# Patient Record
Sex: Male | Born: 2000 | Hispanic: No | Marital: Single | Smoking: Never smoker
Health system: Southern US, Community
[De-identification: ages and names within clinical notes are randomized; demographics above are authoritative.]

---
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
## 2020-05-08 ENCOUNTER — Emergency Department: Payer: BLUE CROSS/BLUE SHIELD

## 2020-05-08 ENCOUNTER — Emergency Department
Admission: EM | Admit: 2020-05-08 | Discharge: 2020-05-08 | Disposition: A | Discharge status: Home / Self Care | Payer: BLUE CROSS/BLUE SHIELD | Attending: Emergency Medicine | Admitting: Emergency Medicine

## 2020-05-08 ENCOUNTER — Encounter: Payer: Self-pay | Admitting: Radiology

## 2020-05-08 ENCOUNTER — Other Ambulatory Visit: Payer: Self-pay

## 2020-05-08 DIAGNOSIS — Y9389 Activity, other specified: Secondary | ICD-10-CM | POA: Insufficient documentation

## 2020-05-08 DIAGNOSIS — W228XXA Striking against or struck by other objects, initial encounter: Secondary | ICD-10-CM | POA: Insufficient documentation

## 2020-05-08 DIAGNOSIS — Y999 Unspecified external cause status: Secondary | ICD-10-CM | POA: Diagnosis not present

## 2020-05-08 DIAGNOSIS — S4991XA Unspecified injury of right shoulder and upper arm, initial encounter: Secondary | ICD-10-CM | POA: Diagnosis present

## 2020-05-08 DIAGNOSIS — S4291XA Fracture of right shoulder girdle, part unspecified, initial encounter for closed fracture: Secondary | ICD-10-CM

## 2020-05-08 DIAGNOSIS — M25511 Pain in right shoulder: Secondary | ICD-10-CM

## 2020-05-08 DIAGNOSIS — Y929 Unspecified place or not applicable: Secondary | ICD-10-CM | POA: Diagnosis not present

## 2020-05-08 DIAGNOSIS — S43014A Anterior dislocation of right humerus, initial encounter: Secondary | ICD-10-CM | POA: Diagnosis not present

## 2020-05-08 DIAGNOSIS — Z79899 Other long term (current) drug therapy: Secondary | ICD-10-CM | POA: Insufficient documentation

## 2020-05-08 MED ORDER — PROPOFOL 10 MG/ML IV BOLUS
INTRAVENOUS | Status: AC
  Filled 2020-05-08: qty 20

## 2020-05-08 MED ORDER — OXYCODONE HCL 5 MG PO TABS
5.0000 mg | ORAL_TABLET | Freq: Once | ORAL | Status: AC
  Administered 2020-05-08: 5 mg via ORAL
  Filled 2020-05-08: qty 1

## 2020-05-08 MED ORDER — PROPOFOL 10 MG/ML IV BOLUS
INTRAVENOUS | Status: AC | PRN
  Administered 2020-05-08: 50 mg via INTRAVENOUS
  Administered 2020-05-08: 10 mg via INTRAVENOUS
  Administered 2020-05-08: 20 mg via INTRAVENOUS
  Administered 2020-05-08: 30 mg via INTRAVENOUS
  Administered 2020-05-08 (×2): 20 mg via INTRAVENOUS
  Administered 2020-05-08: 30 mg via INTRAVENOUS
  Administered 2020-05-08: 20 mg via INTRAVENOUS
  Administered 2020-05-08: 10 mg via INTRAVENOUS

## 2020-05-08 MED ORDER — PROPOFOL 10 MG/ML IV BOLUS
1.0000 mg/kg | Freq: Once | INTRAVENOUS | Status: DC
  Filled 2020-05-08: qty 20

## 2020-05-08 MED ORDER — ACETAMINOPHEN 500 MG PO TABS
1000.0000 mg | ORAL_TABLET | Freq: Once | ORAL | Status: AC
  Administered 2020-05-08: 1000 mg via ORAL
  Filled 2020-05-08: qty 2

## 2020-05-08 MED ORDER — SODIUM CHLORIDE 0.9 % IV SOLN
INTRAVENOUS | Status: AC | PRN
  Administered 2020-05-08: 1000 mL via INTRAVENOUS

## 2020-05-08 MED ORDER — FENTANYL CITRATE (PF) 100 MCG/2ML IJ SOLN
50.0000 ug | Freq: Once | INTRAMUSCULAR | Status: AC
  Administered 2020-05-08: 50 ug via NASAL
  Filled 2020-05-08: qty 2

## 2020-05-08 MED ORDER — LIDOCAINE-EPINEPHRINE 2 %-1:100000 IJ SOLN
20.0000 mL | Freq: Once | INTRAMUSCULAR | Status: AC
  Administered 2020-05-08: 20 mL

## 2020-05-08 MED ORDER — LIDOCAINE-EPINEPHRINE 1 %-1:100000 IJ SOLN
20.0000 mL | Freq: Once | INTRAMUSCULAR | Status: DC
  Filled 2020-05-08: qty 20

## 2020-05-08 NOTE — ED Triage Notes (Signed)
Pt reports playing basketball and was hit while jumping in the air, pt reports that his shoulder has been out of place 2 times prior, reports that he is having pain and it reminds him of the last time it was dislocated

## 2020-05-08 NOTE — ED Notes (Signed)
Pt able to eat and drink, ambulatory to toilet

## 2020-05-08 NOTE — ED Notes (Signed)
Pt states no change in pain at this time, 2nd injection of lidocaine with epi given by EDP Smith at this time.

## 2020-05-08 NOTE — Sedation Documentation (Signed)
Physician able to reduce R shoulder at this time.

## 2020-05-08 NOTE — ED Notes (Signed)
Pt to room from X-ray.

## 2020-05-08 NOTE — Sedation Documentation (Signed)
Pt given popsicle for PO challenge.

## 2020-05-08 NOTE — Sedation Documentation (Signed)
Pt alert, eyes open and speaking with this RN, Hospital doctor, RN and his mother. Pt's R arm currently in sling. Awaiting X-ray to come to bedside to repeat shoulder X-ray.

## 2020-05-08 NOTE — ED Provider Notes (Signed)
[facility] Emergency Department Provider Note ____________________________________________   First MD Initiated Contact with Patient 05/08/20 1624     (approximate)  I have reviewed the triage vital signs and the nursing notes.  HISTORY  Chief Complaint Shoulder Pain   HPI Colton Whitaker is a 19 y.o. malewho presents to the ED for evaluation of right shoulder pain and possible dislocation.   Chart review indicates no relevant history.  Patient self-reports a history of 2 previous right shoulder dislocations, most recently 2 years ago.  No surgical history to the shoulder.  Patient reports being in his typical state of health until about 3 hours ago when he was playing basketball with friends when he went up for a lay up, 2 people hit him on the air and reports dislocation on his way down.  Reports immediate right shoulder pain that has been constant and present for the past 3 hours.  8/10 intensity, aching in nature and patient has not taken medications prior to arrival.  History reviewed. No pertinent past medical history.  There are no problems to display for this patient.   No past surgical history on file.  Prior to Admission medications   Medication Sig Start Date End Date Taking? Authorizing Provider  ibuprofen (ADVIL,MOTRIN) 400 MG tablet Take 1 tablet (400 mg total) by mouth every 6 (six) hours as needed. 06/17/18   Enid Derry, PA-C    Allergies Patient has no known allergies.  No family history on file.  Social History Social History   Tobacco Use  . Smoking status: Not on file  Substance Use Topics  . Alcohol use: Not on file  . Drug use: Not on file    Review of Systems  Constitutional: No fever/chills Eyes: No visual changes. ENT: No sore throat. Cardiovascular: Denies chest pain. Respiratory: Denies shortness of breath. Gastrointestinal: No abdominal pain.  No nausea, no vomiting.  No diarrhea.  No  constipation. Genitourinary: Negative for dysuria. Musculoskeletal: Negative for back pain.  Positive for right shoulder pain Skin: Negative for rash. Neurological: Negative for headaches, focal weakness or numbness.   ____________________________________________   PHYSICAL EXAM:  VITAL SIGNS: Vitals:   05/08/20 1945 05/08/20 2000  BP: 139/80 137/73  Pulse: 81 81  Resp: 18 13  Temp:    SpO2: 100% 100%      Constitutional: Alert and oriented. Well appearing and in no acute distress.  Sling to his right shoulder.  Pleasant and conversational full sentences without distress. Eyes: Conjunctivae are normal. PERRL. EOMI. Head: Atraumatic. Nose: No congestion/rhinnorhea. Mouth/Throat: Mucous membranes are moist.  Oropharynx non-erythematous. Neck: No stridor. No cervical spine tenderness to palpation. Cardiovascular: Normal rate, regular rhythm. Grossly normal heart sounds.  Good peripheral circulation. Respiratory: Normal respiratory effort.  No retractions. Lungs CTAB. Gastrointestinal: Soft , nondistended, nontender to palpation. No abdominal bruits. No CVA tenderness. Musculoskeletal: No lower extremity tenderness nor edema.  No joint effusions. No signs of acute trauma. Right upper extremity is distally neurovascularly intact. Soft tissue inferior to the glenoid that is asymmetric right to the left most consistent with a dislocation. Neurologic:  Normal speech and language. No gross focal neurologic deficits are appreciated. No gait instability noted. Skin:  Skin is warm, dry and intact. No rash noted. Psychiatric: Mood and affect are normal. Speech and behavior are normal.  ____________________________________________   LABS (all labs ordered are listed, but only abnormal results are displayed)  Labs Reviewed - No data to display  ____________________________________________  RADIOLOGY  ED MD interpretation: Initial x-ray right shoulder demonstrates anterior  dislocation.  Follow-up x-ray indicates successful reduction of his right shoulder.  Official radiology report(s): DG Shoulder Right  Result Date: 05/08/2020 CLINICAL DATA:  Right shoulder dislocation status post reduction. EXAM: RIGHT SHOULDER - 2+ VIEW COMPARISON:  None. FINDINGS: Internally rotated and trans-scapular views of the right shoulder demonstrate satisfactory reduction. There is likely a small Hill-Sachs impaction. Subtle inferior glenoid irregularity on 1 of the internally rotated projections, bony Bankart injury not excluded. IMPRESSION: 1. Successful reduction of the right shoulder. 2. Suspected small Hill-Sachs impaction. 3. Equivocal appearance for bony Bankart injury. Electronically Signed   By: Gaylyn RongWalter  Liebkemann M.D.   On: 05/08/2020 19:24   DG Shoulder Right  Result Date: 05/08/2020 CLINICAL DATA:  Trauma with history of shoulder dislocation EXAM: RIGHT SHOULDER - 2+ VIEW COMPARISON:  06/17/2018 FINDINGS: Anterior inferior dislocation of the humeral head. No Hill-Sachs deformity identified. Visualized portion of the right hemithorax is normal. IMPRESSION: Anterior inferior dislocation of the humeral head. Electronically Signed   By: Jeronimo GreavesKyle  Talbot M.D.   On: 05/08/2020 16:28   ____________________________________________   PROCEDURES and INTERVENTIONS  Procedure(s) performed (including Critical Care):  .Sedation  Date/Time: 05/08/2020 6:36 PM Performed by: Delton PrairieSmith, Odin Mariani, MD Authorized by: Delton PrairieSmith, Drayson Dorko, MD   Consent:    Consent obtained:  Verbal and written   Consent given by:  Patient and parent   Risks discussed:  Allergic reaction, dysrhythmia, inadequate sedation, nausea, prolonged hypoxia resulting in organ damage, prolonged sedation necessitating reversal, respiratory compromise necessitating ventilatory assistance and intubation and vomiting   Alternatives discussed:  Analgesia without sedation, anxiolysis and regional anesthesia Universal protocol:    Procedure  explained and questions answered to patient or proxy's satisfaction: yes     Relevant documents present and verified: yes     Test results available and properly labeled: yes     Imaging studies available: yes     Required blood products, implants, devices, and special equipment available: yes     Site/side marked: yes     Immediately prior to procedure a time out was called: yes     Patient identity confirmation method:  Verbally with patient and provided demographic data Indications:    Procedure performed:  Dislocation reduction   Procedure necessitating sedation performed by:  Physician performing sedation Pre-sedation assessment:    Time since last food or drink:  4 hrs   ASA classification: class 1 - normal, healthy patient     Neck mobility: normal     Mouth opening:  3 or more finger widths   Thyromental distance:  4 finger widths   Mallampati score:  I - soft palate, uvula, fauces, pillars visible   Pre-sedation assessments completed and reviewed: airway patency, cardiovascular function, hydration status, mental status, nausea/vomiting, pain level, respiratory function and temperature   Immediate pre-procedure details:    Reassessment: Patient reassessed immediately prior to procedure     Reviewed: vital signs, relevant labs/tests and NPO status     Verified: bag valve mask available, emergency equipment available, intubation equipment available, IV patency confirmed, oxygen available and suction available   Procedure details (see MAR for exact dosages):    Preoxygenation:  Nasal cannula   Sedation:  Propofol   Intended level of sedation: deep   Intra-procedure monitoring:  Blood pressure monitoring, cardiac monitor, continuous pulse oximetry, frequent LOC assessments, frequent vital sign checks and continuous capnometry   Intra-procedure events: none     Total Provider sedation time (  minutes):  10 Post-procedure details:    Attendance: Constant attendance by certified staff  until patient recovered     Recovery: Patient returned to pre-procedure baseline     Post-sedation assessments completed and reviewed: airway patency, cardiovascular function, hydration status, mental status, nausea/vomiting, pain level, respiratory function and temperature     Patient is stable for discharge or admission: yes     Patient tolerance:  Tolerated well, no immediate complications  Reduction of dislocation  Date/Time: 05/08/2020 9:46 PM Performed by: Delton Prairie, MD Authorized by: Delton Prairie, MD  Local anesthesia used: yes Anesthesia method: Intra-articular lidocaine to the right shoulder.  Anesthesia: Local anesthesia used: yes Local Anesthetic: lidocaine 2% with epinephrine Anesthetic total: 20 mL  Sedation: Patient sedated: yes Sedatives: propofol  Patient tolerance: patient tolerated the procedure well with no immediate complications Comments: Traction and countertraction initially used with palpable bony movement, but no distinct clunk to indicate reduction. Then externally rotated the right upper extremity and provided abduction traction with good reduction.  Marland Kitchen1-3 Lead EKG Interpretation Performed by: Delton Prairie, MD Authorized by: Delton Prairie, MD     Interpretation: normal     ECG rate:  80   ECG rate assessment: normal     Rhythm: sinus rhythm     Ectopy: none     Conduction: normal      Medications  propofol (DIPRIVAN) 10 mg/mL bolus/IV push 79.8 mg (79.8 mg Intravenous See Procedure Record 05/08/20 1819)  acetaminophen (TYLENOL) tablet 1,000 mg (1,000 mg Oral Given 05/08/20 1701)  oxyCODONE (Oxy IR/ROXICODONE) immediate release tablet 5 mg (5 mg Oral Given 05/08/20 1701)  fentaNYL (SUBLIMAZE) injection 50 mcg (50 mcg Nasal Given 05/08/20 1659)  lidocaine-EPINEPHrine (XYLOCAINE W/EPI) 2 %-1:100000 (with pres) injection 20 mL (20 mLs Infiltration Given 05/08/20 1702)  0.9 %  sodium chloride infusion ( Intravenous Stopped 05/08/20 2006)  propofol (DIPRIVAN) 10  mg/mL bolus/IV push (20 mg Intravenous Given 05/08/20 1827)  propofol (DIPRIVAN) 10 mg/mL bolus/IV push (  Return to [facility] 05/08/20 1855)    ____________________________________________   MDM / ED COURSE  19 year old male with history of right shoulder dislocation presents with recurrent anterior dislocation amenable to bedside reduction and outpatient management.  Normal vital signs on room air.  Exam demonstrates dislocated right shoulder that is neurovascularly intact.  No laceration or evidence of open injury.  No further signs of trauma beyond his dislocated right shoulder.  X-ray confirms dislocation without fracture.  Initially attempted intra-articular lidocaine for reduction without sedation, but this was poorly tolerated.  Then transitioned to moderate sedation with propofol, as documented above, with good subsequent reduction of his right shoulder.  Repeat x-ray confirms reduction, possible Hill-Sachs deformity.  Patient placed in sling after reduction.  He wakes from sedation without complication.  Subsequently tolerating p.o. intake and ambulating without difficulty.  Mother reports that he has sports medicine physician already, and I provided them with additional orthopedic surgery information for follow-up.  We discussed outpatient management and we discussed return precautions for the ED.  Patient medically stable for discharge home.  Clinical Course as of May 09 2147  Wynelle Link May 08, 2020  1704 Intra-articular lidocaine applied   [DS]  1716 Additional 10 cc of lidocaine applied   [DS]  1742 Continued pain despite intra-articular injection.  Patient will not tolerate reduction like this.  We will place IV and sedate the patient .patient and mom are agreeable to this.   [DS]  1830 Reduction complete associated by propofol sedation.  Well-tolerated.  Required all 200 mg of propofol   [DS]  1858 Reassessed.  Repeat x-rays look good.  Patient awake and at his baseline mental status.  We  discussed outpatient management with orthopedic follow-up.  Mother reports that patient has a sports medicine physician that is available to him   [DS]    Clinical Course User Index [DS] Delton Prairie, MD     ____________________________________________   FINAL CLINICAL IMPRESSION(S) / ED DIAGNOSES  Final diagnoses:  Acute pain of right shoulder  Traumatic closed displaced fracture of right shoulder with anterior dislocation, initial encounter     ED Discharge Orders    None       Sael Furches   Note:  This document was prepared using Dragon voice recognition software and may include unintentional dictation errors.   Delton Prairie, MD 05/08/20 2151

## 2020-05-08 NOTE — ED Notes (Addendum)
Peripheral IV discontinued. Catheter intact. No signs of infiltration or redness. Gauze applied to IV site.   Discharge instructions reviewed with patient. Questions fielded by this RN. Patient verbalizes understanding of instructions. Patient discharged home in stable condition per smith . No acute distress noted at time of discharge.   Pt ambu to family vehicle  Pt and mother have 2 originals of moderate sedation paperwork, copy filed

## 2020-05-08 NOTE — Discharge Instructions (Addendum)
Please take Tylenol and ibuprofen/Advil for your pain.  It is safe to take them together, or to alternate them every few hours.  Take up to 1000mg  of Tylenol at a time, up to 4 times per day.  Do not take more than 4000 mg of Tylenol in 24 hours.  For ibuprofen, take 400-600 mg, 4-5 times per day.  Keep your right arm in a sling and follow up with the orthopedic doctors in the next 1-2 weeks. Their information is attached.  You may also follow-up with your sports medicine doctor.  Return to the ED with any recurrent dislocations, inability to feel/use your right arm/hand.

## 2020-05-08 NOTE — Sedation Documentation (Signed)
Family updated as to patient's status. Pt's mother remains at bedside at this time.

## 2021-07-10 IMAGING — CR DG SHOULDER 2+V*R*
1 series · 3 of 3 positions shown · non-contrast
Comparison: 06/17/2018

CLINICAL DATA: Trauma with history of shoulder dislocation

EXAM:
RIGHT SHOULDER - 2+ VIEW

[Series 1: dg shoulder right · 0.14mm/px · 3 of 3 slices shown]
[im 1/3]
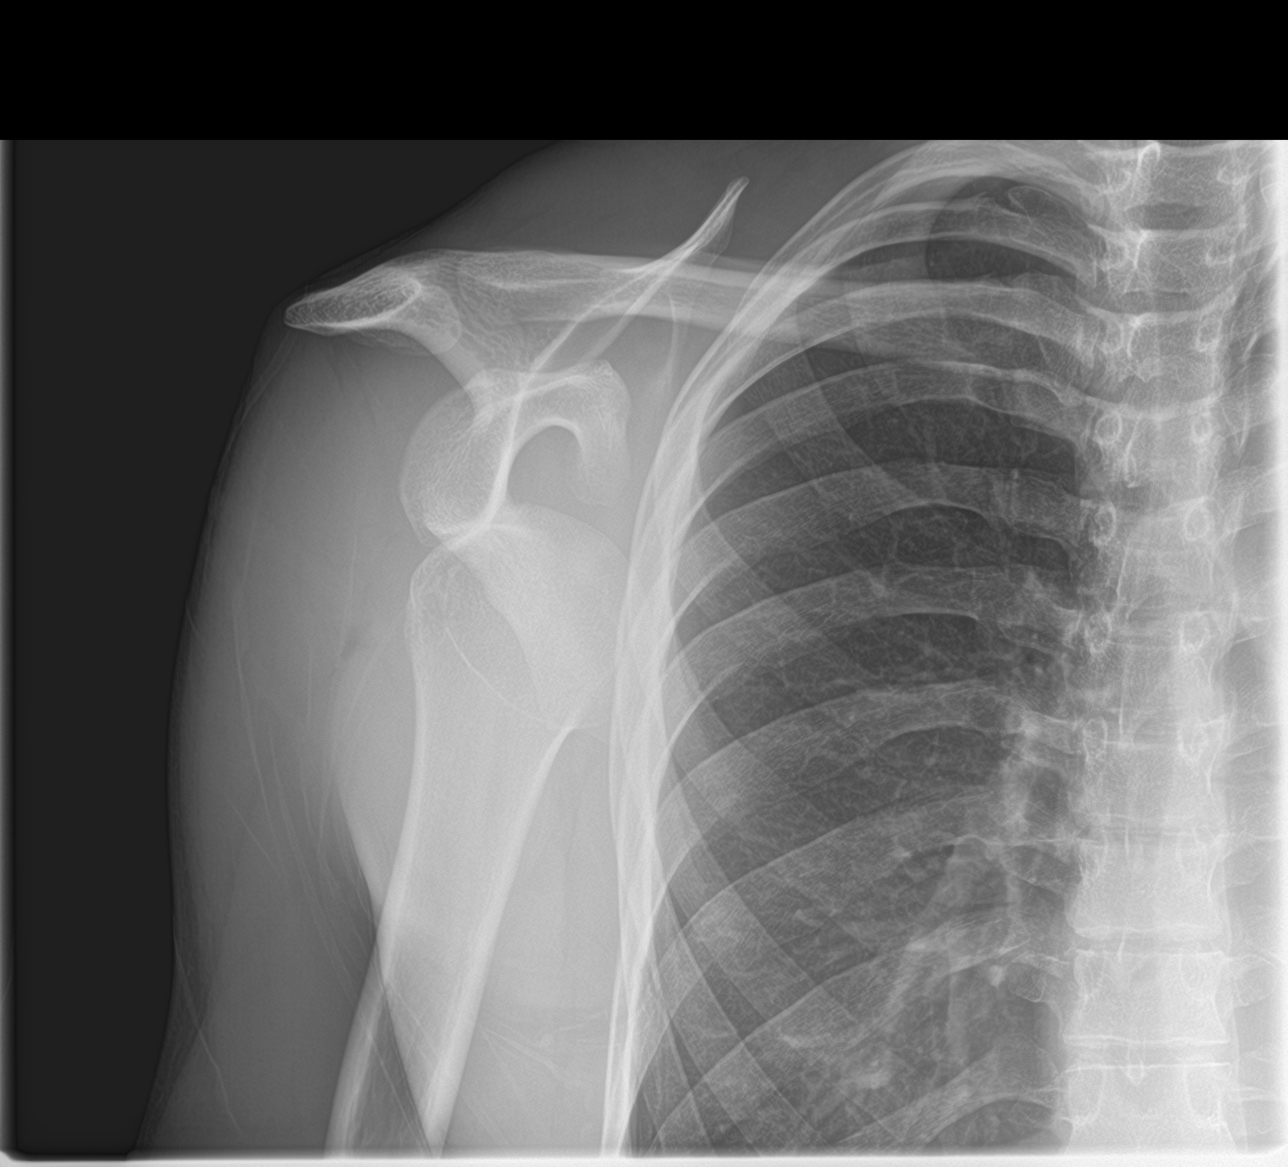
[im 2/3]
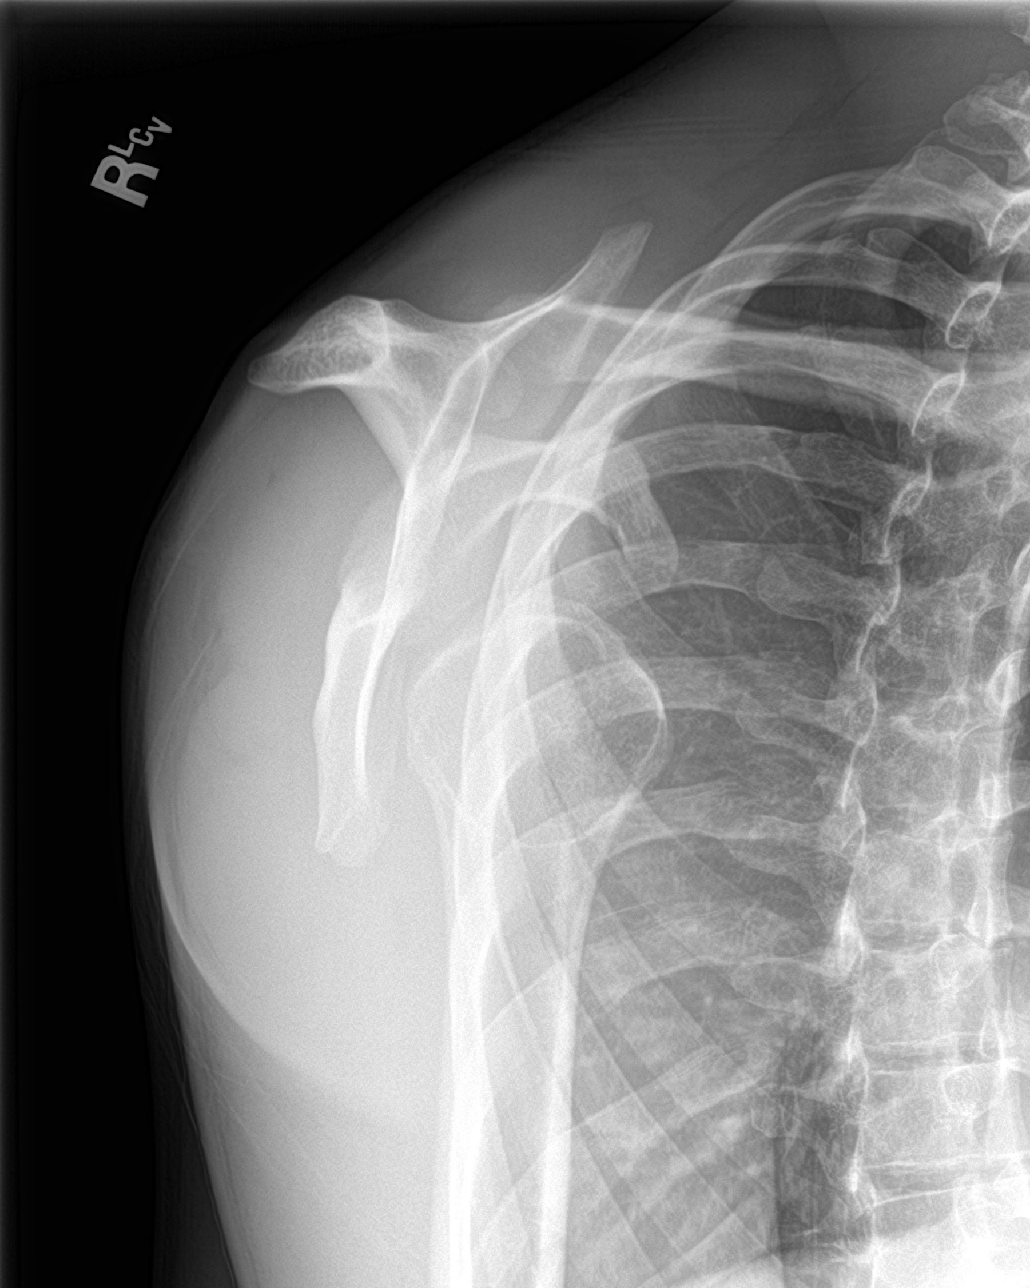
[im 3/3]
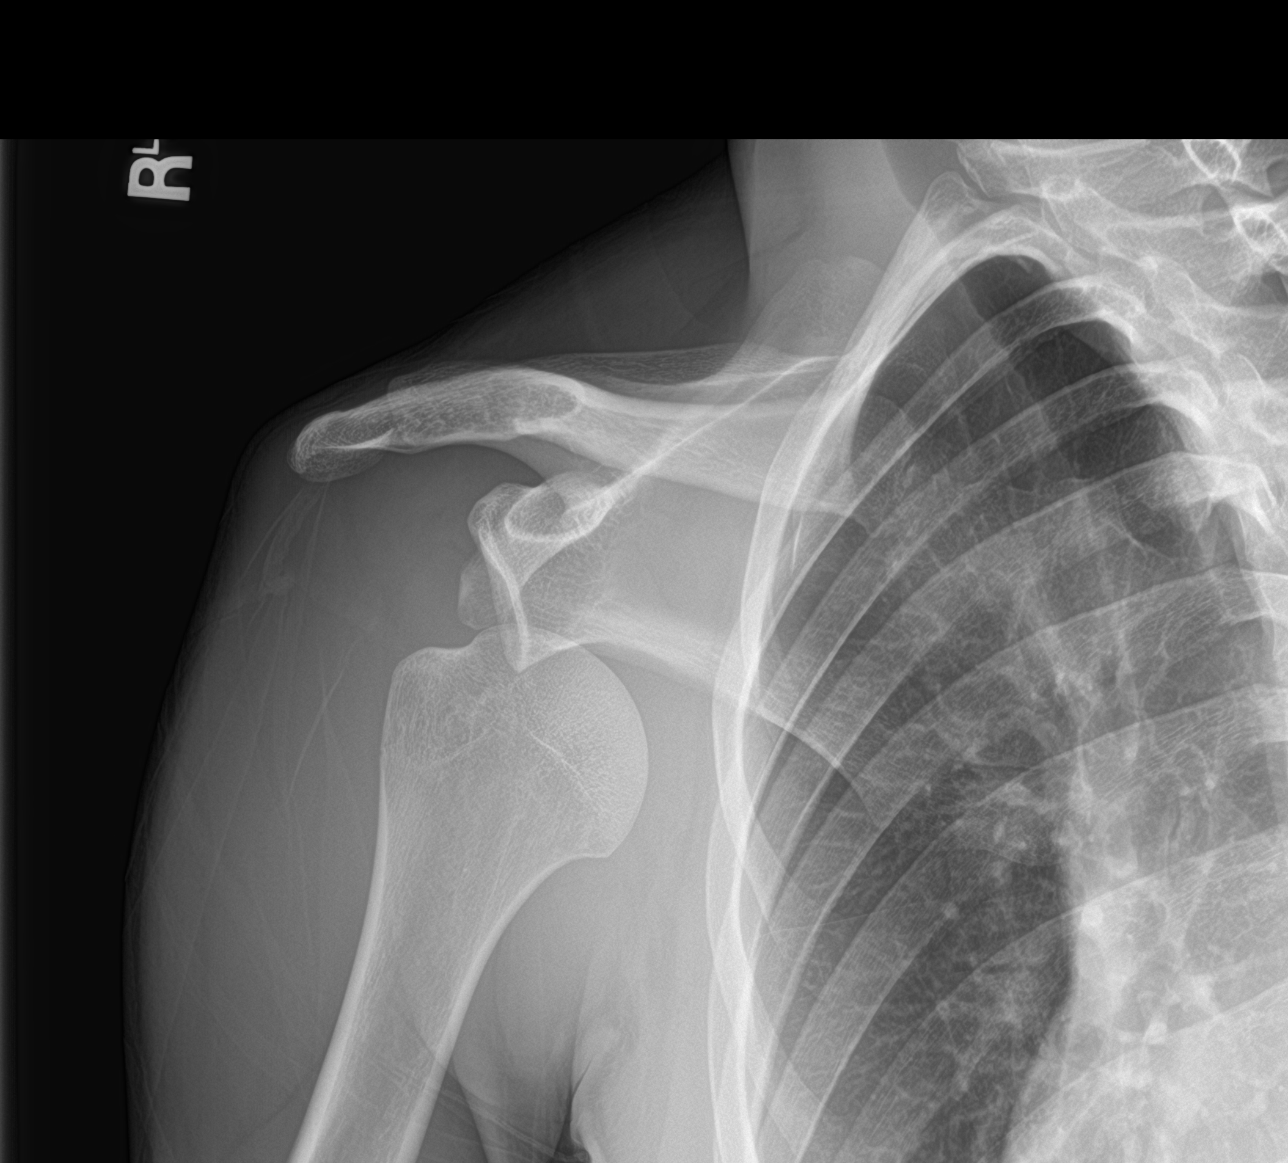

[3 of 3 positions shown; findings below may reference images not displayed]

FINDINGS: Anterior inferior dislocation of the humeral head. No Hill-Sachs
deformity identified. Visualized portion of the right hemithorax is
normal.
IMPRESSION: Anterior inferior dislocation of the humeral head.

## 2021-07-10 IMAGING — DX DG SHOULDER 2+V*R*
3 series · 3 of 3 positions shown · non-contrast
Comparison: None.

CLINICAL DATA: Right shoulder dislocation status post reduction.

EXAM:
RIGHT SHOULDER - 2+ VIEW

[shoulder ap (1 of 2)]
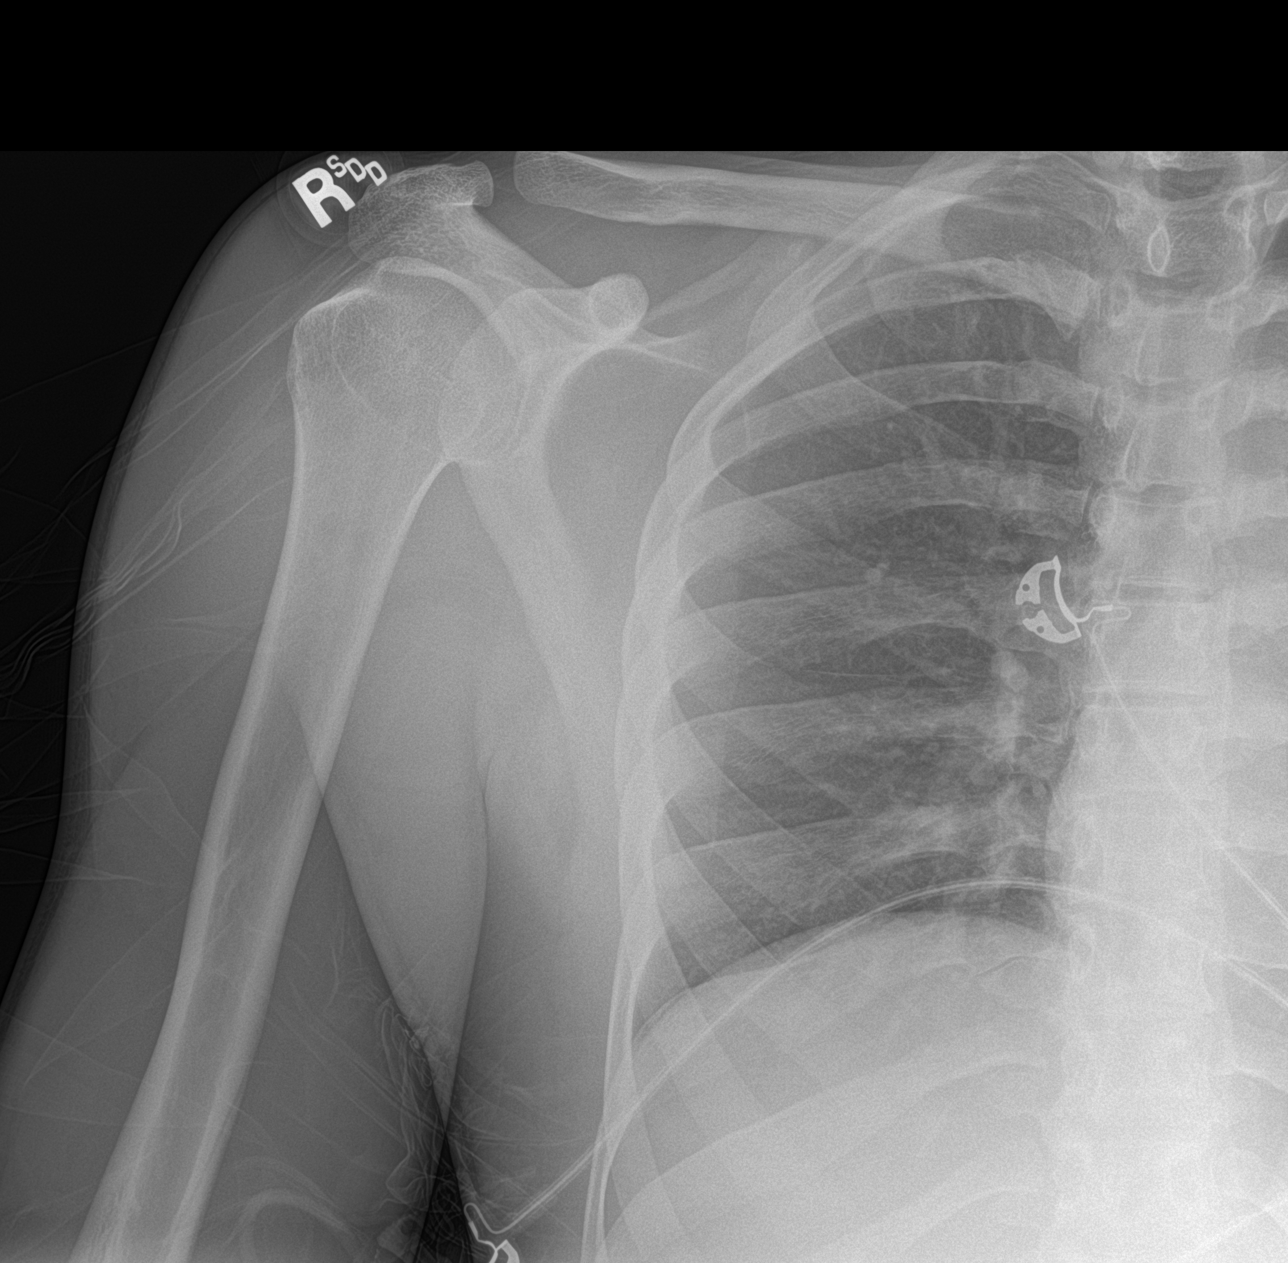

[shoulder ap (2 of 2)]
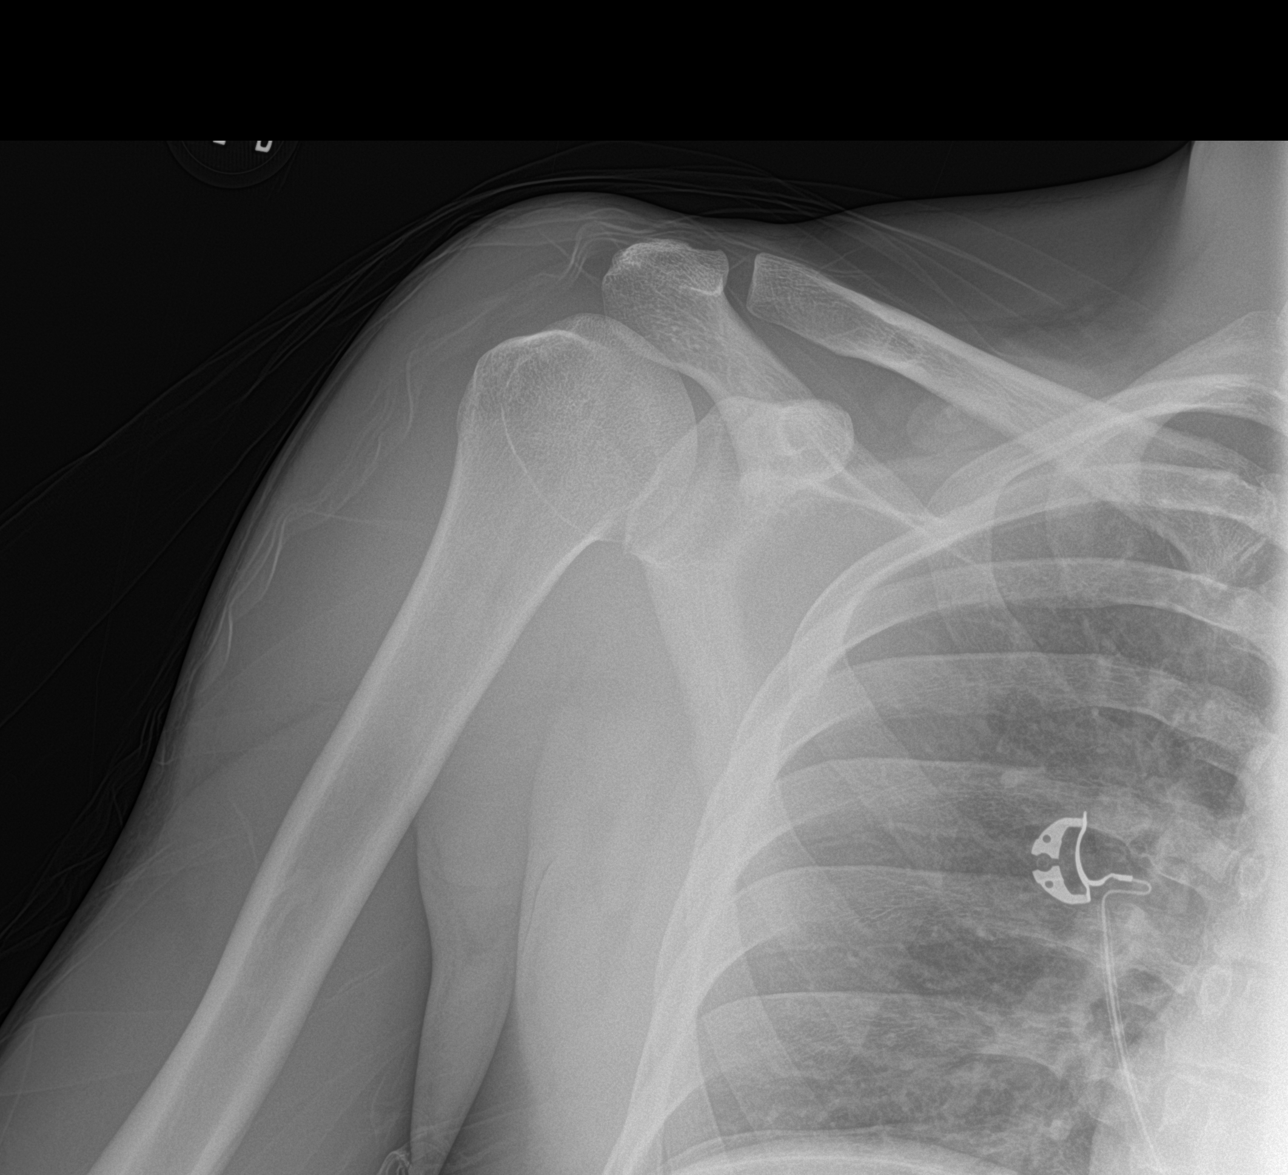

[shoulder swimmer]
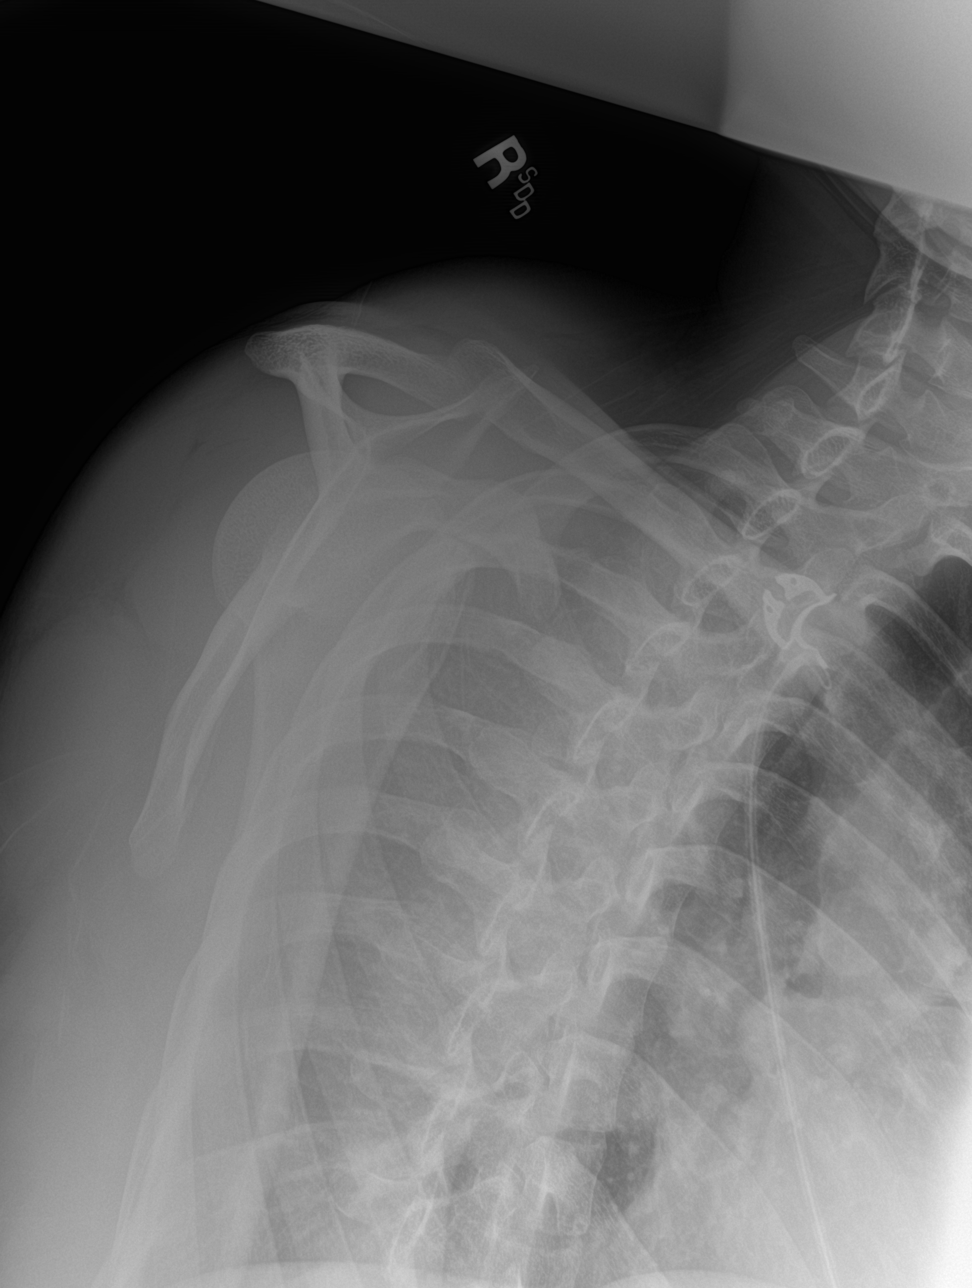

[3 of 3 positions shown; findings below may reference images not displayed]

FINDINGS: Internally rotated and trans-scapular views of the right shoulder
demonstrate satisfactory reduction. There is likely a small
Hill-Sachs impaction. Subtle inferior glenoid irregularity on 1 of
the internally rotated projections, bony Bankart injury not
excluded.
IMPRESSION: 1. Successful reduction of the right shoulder.
2. Suspected small Hill-Sachs impaction.
3. Equivocal appearance for bony Bankart injury.

## 2023-09-06 ENCOUNTER — Ambulatory Visit
Admission: RE | Admit: 2023-09-06 | Discharge: 2023-09-06 | Disposition: A | Discharge status: Home / Self Care | Payer: BLUE CROSS/BLUE SHIELD | Source: Ambulatory Visit | Attending: Emergency Medicine | Admitting: Emergency Medicine

## 2023-09-06 ENCOUNTER — Other Ambulatory Visit: Payer: Self-pay

## 2023-09-06 VITALS — BP 128/71 | HR 70 | Temp 98.3°F | Resp 16

## 2023-09-06 DIAGNOSIS — J069 Acute upper respiratory infection, unspecified: Secondary | ICD-10-CM

## 2023-09-06 MED ORDER — PROMETHAZINE-DM 6.25-15 MG/5ML PO SYRP: 5.0000 mL | ORAL_SOLUTION | Freq: Every evening | ORAL | 0 refills | Status: AC | PRN

## 2023-09-06 MED ORDER — BENZONATATE 100 MG PO CAPS: 100.0000 mg | ORAL_CAPSULE | Freq: Three times a day (TID) | ORAL | 0 refills | Status: AC

## 2023-09-06 MED ORDER — AZITHROMYCIN 250 MG PO TABS: 250.0000 mg | ORAL_TABLET | Freq: Every day | ORAL | 0 refills | Status: AC

## 2023-09-06 NOTE — ED Triage Notes (Signed)
 Since WED c/o HA, congestion, chills, subjective fever

## 2023-09-06 NOTE — Discharge Instructions (Addendum)
 Your symptoms today are most likely being caused by a virus and should steadily improve in time it can take up to 7 to 10 days before you truly start to see a turnaround however things will get better, if symptoms begin to worsen initiate azithromycin   Take Tessalon  pill every 8 hours as needed to help calm cough, may use cough syrup at bedtime if needed for additional comfort    You can take Tylenol  and/or Ibuprofen  as needed for fever reduction and pain relief.   For cough: honey 1/2 to 1 teaspoon (you can dilute the honey in water or another fluid).  You can also use guaifenesin and dextromethorphan for cough. You can use a humidifier for chest congestion and cough.  If you don't have a humidifier, you can sit in the bathroom with the hot shower running.      For sore throat: try warm salt water gargles, cepacol lozenges, throat spray, warm tea or water with lemon/honey, popsicles or ice, or OTC cold relief medicine for throat discomfort.   For congestion: take a daily anti-histamine like Zyrtec, Claritin, and a oral decongestant, such as pseudoephedrine.  You can also use Flonase 1-2 sprays in each nostril daily.   It is important to stay hydrated: drink plenty of fluids (water, gatorade/powerade/pedialyte, juices, or teas) to keep your throat moisturized and help further relieve irritation/discomfort.

## 2023-09-06 NOTE — ED Provider Notes (Signed)
 [facility]    CSN: [id number] Arrival date & time: 09/06/23  1752      History   Chief Complaint Chief Complaint  Patient presents with   Nasal Congestion    Entered by patient    HPI Colton Whitaker is a 23 y.o. male.   Patient presents for evaluation of nasal congestion, rhinorrhea, productive cough, and nausea without vomiting present for 2 to 3 days.  Associated subjective fever.  Known sick contacts prior.  Has been managing with TheraFlu and NyQuil.  Tolerating food and liquids.  Denies respiratory history, non-smoker.  History reviewed. No pertinent past medical history.  There are no active problems to display for this patient.   History reviewed. No pertinent surgical history.     Home Medications    Prior to Admission medications   Medication Sig Start Date End Date Taking? Authorizing Provider  azithromycin  (ZITHROMAX ) 250 MG tablet Take 1 tablet (250 mg total) by mouth daily. Take first 2 tablets together, then 1 every day until finished. 09/06/23  Yes Carissa Musick R, NP  benzonatate  (TESSALON ) 100 MG capsule Take 1 capsule (100 mg total) by mouth every 8 (eight) hours. 09/06/23  Yes Andrw Mcguirt, Shelba SAUNDERS, NP  promethazine -dextromethorphan (PROMETHAZINE -DM) 6.25-15 MG/5ML syrup Take 5 mLs by mouth at bedtime as needed. 09/06/23  Yes Myrtis Maille R, NP  ibuprofen  (ADVIL ,MOTRIN ) 400 MG tablet Take 1 tablet (400 mg total) by mouth every 6 (six) hours as needed. Patient not taking: Reported on 09/06/2023 06/17/18   Alona Knee, PA-C    Family History History reviewed. No pertinent family history.  Social History Social History   Tobacco Use   Smoking status: Never   Smokeless tobacco: Never  Vaping Use   Vaping status: Never Used  Substance Use Topics   Alcohol use: Never   Drug use: Never     Allergies   Patient has no known allergies.   Review of Systems Review of Systems   Physical Exam Triage Vital Signs ED Triage Vitals   Encounter Vitals Group     BP 09/06/23 1818 128/71     Systolic BP Percentile --      Diastolic BP Percentile --      Pulse Rate 09/06/23 1818 70     Resp 09/06/23 1818 16     Temp 09/06/23 1818 98.3 F (36.8 C)     Temp Source 09/06/23 1818 Oral     SpO2 09/06/23 1818 97 %     Weight --      Height --      Head Circumference --      Peak Flow --      Pain Score 09/06/23 1823 1     Pain Loc --      Pain Education --      Exclude from Growth Chart --    No data found.  Updated Vital Signs BP 128/71 (BP Location: Left Arm)   Pulse 70   Temp 98.3 F (36.8 C) (Oral)   Resp 16   SpO2 97%   Visual Acuity Right Eye Distance:   Left Eye Distance:   Bilateral Distance:    Right Eye Near:   Left Eye Near:    Bilateral Near:     Physical Exam Constitutional:      Appearance: Normal appearance.  HENT:     Right Ear: Tympanic membrane, ear canal and external ear normal.     Left Ear: Tympanic membrane, ear canal and external ear  normal.     Nose: Congestion present. No rhinorrhea.     Mouth/Throat:     Mouth: Mucous membranes are moist.     Pharynx: Oropharynx is clear. Posterior oropharyngeal erythema present. No oropharyngeal exudate.     Comments: mild Eyes:     Extraocular Movements: Extraocular movements intact.  Cardiovascular:     Rate and Rhythm: Normal rate and regular rhythm.     Pulses: Normal pulses.     Heart sounds: Normal heart sounds.  Pulmonary:     Effort: Pulmonary effort is normal.     Breath sounds: Normal breath sounds.  Musculoskeletal:     Cervical back: Normal range of motion and neck supple.  Neurological:     Mental Status: He is alert and oriented to person, place, and time. Mental status is at baseline.      UC Treatments / Results  Labs (all labs ordered are listed, but only abnormal results are displayed) Labs Reviewed - No data to display  EKG   Radiology No results found.  Procedures Procedures (including critical  care time)  Medications Ordered in UC Medications - No data to display  Initial Impression / Assessment and Plan / UC Course  I have reviewed the triage vital signs and the nursing notes.  Pertinent labs & imaging results that were available during my care of the patient were reviewed by me and considered in my medical decision making (see chart for details).  Viral URI with cough  Patient is in no signs of distress nor toxic appearing.  Vital signs are stable.  Low suspicion for pneumonia, pneumothorax or bronchitis and therefore will defer imaging.  Plan COVID and flu testing.  Prescribed Tessalon  and Promethazine  DM for cough, watch wait antibiotic prescribed if no improvement seen, azithromycin  sent to pharmacy.May use additional over-the-counter medications as needed for supportive care.  May follow-up with urgent care as needed if symptoms persist or worsen.    Final Clinical Impressions(s) / UC Diagnoses   Final diagnoses:  Viral URI with cough     Discharge Instructions      Your symptoms today are most likely being caused by a virus and should steadily improve in time it can take up to 7 to 10 days before you truly start to see a turnaround however things will get better, if symptoms begin to worsen initiate azithromycin   Take Tessalon  pill every 8 hours as needed to help calm cough, may use cough syrup at bedtime if needed for additional comfort    You can take Tylenol  and/or Ibuprofen  as needed for fever reduction and pain relief.   For cough: honey 1/2 to 1 teaspoon (you can dilute the honey in water or another fluid).  You can also use guaifenesin and dextromethorphan for cough. You can use a humidifier for chest congestion and cough.  If you don't have a humidifier, you can sit in the bathroom with the hot shower running.      For sore throat: try warm salt water gargles, cepacol lozenges, throat spray, warm tea or water with lemon/honey, popsicles or ice, or OTC cold  relief medicine for throat discomfort.   For congestion: take a daily anti-histamine like Zyrtec, Claritin, and a oral decongestant, such as pseudoephedrine.  You can also use Flonase 1-2 sprays in each nostril daily.   It is important to stay hydrated: drink plenty of fluids (water, gatorade/powerade/pedialyte, juices, or teas) to keep your throat moisturized and help further relieve irritation/discomfort.  ED Prescriptions     Medication Sig Dispense Auth. Provider   azithromycin  (ZITHROMAX ) 250 MG tablet Take 1 tablet (250 mg total) by mouth daily. Take first 2 tablets together, then 1 every day until finished. 6 tablet Jennalyn Cawley R, NP   benzonatate  (TESSALON ) 100 MG capsule Take 1 capsule (100 mg total) by mouth every 8 (eight) hours. 21 capsule Arnaldo Heffron R, NP   promethazine -dextromethorphan (PROMETHAZINE -DM) 6.25-15 MG/5ML syrup Take 5 mLs by mouth at bedtime as needed. 118 mL Aerith Canal, Shelba SAUNDERS, NP      PDMP not reviewed this encounter.   Teresa Shelba SAUNDERS, NP 09/06/23 [id number]
